# Patient Record
Sex: Male | Born: 1980 | Race: Black or African American | Hispanic: No | Marital: Single | Smoking: Never smoker
Health system: Southern US, Community
[De-identification: ages and names within clinical notes are randomized; demographics above are authoritative.]

## PROBLEM LIST (undated history)

## (undated) DIAGNOSIS — I10 Essential (primary) hypertension: Secondary | ICD-10-CM

## (undated) DIAGNOSIS — E785 Hyperlipidemia, unspecified: Secondary | ICD-10-CM

## (undated) HISTORY — DX: Essential (primary) hypertension: I10

## (undated) HISTORY — DX: Hyperlipidemia, unspecified: E78.5

---

## 2006-09-03 ENCOUNTER — Emergency Department (HOSPITAL_COMMUNITY): Admission: EM | Admit: 2006-09-03 | Discharge: 2006-09-03 | Payer: Self-pay | Admitting: Emergency Medicine

## 2009-07-27 ENCOUNTER — Inpatient Hospital Stay (HOSPITAL_COMMUNITY): Admission: AC | Admit: 2009-07-27 | Discharge: 2009-07-27 | Payer: Self-pay

## 2009-09-03 ENCOUNTER — Ambulatory Visit (HOSPITAL_COMMUNITY): Admission: RE | Admit: 2009-09-03 | Discharge: 2009-09-03 | Payer: Self-pay | Admitting: Otolaryngology

## 2010-07-27 LAB — BASIC METABOLIC PANEL
BUN: 9 mg/dL (ref 6–23)
Creatinine, Ser: 0.81 mg/dL (ref 0.4–1.5)
Glucose, Bld: 102 mg/dL — ABNORMAL HIGH (ref 70–99)
Potassium: 4.4 mEq/L (ref 3.5–5.1)
Sodium: 139 mEq/L (ref 135–145)

## 2010-07-27 LAB — CBC
Hemoglobin: 14.2 g/dL (ref 13.0–17.0)
MCHC: 33.5 g/dL (ref 30.0–36.0)
MCV: 84.2 fL (ref 78.0–100.0)
Platelets: 200 10*3/uL (ref 150–400)
RBC: 5.02 MIL/uL (ref 4.22–5.81)

## 2010-08-02 LAB — CBC
Hemoglobin: 14.7 g/dL (ref 13.0–17.0)
MCV: 84.2 fL (ref 78.0–100.0)
RBC: 5.17 MIL/uL (ref 4.22–5.81)
WBC: 8.4 10*3/uL (ref 4.0–10.5)

## 2010-08-02 LAB — COMPREHENSIVE METABOLIC PANEL
ALT: 19 U/L (ref 0–53)
AST: 28 U/L (ref 0–37)
CO2: 28 meq/L (ref 19–32)
Chloride: 106 meq/L (ref 96–112)
GFR calc Af Amer: >60 mL/min (ref >60)
GFR calc non Af Amer: >60 mL/min (ref >60)
Glucose, Bld: 107 mg/dL — ABNORMAL HIGH (ref 70–99)
Sodium: 142 meq/L (ref 135–145)
Total Bilirubin: 0.8 mg/dL (ref 0.3–1.2)

## 2010-08-02 LAB — TYPE AND SCREEN: Antibody Screen: NEGATIVE

## 2010-08-02 LAB — POCT I-STAT, CHEM 8
BUN: 7 mg/dL (ref 6–23)
Chloride: 105 meq/L (ref 96–112)
Creatinine, Ser: 1.2 mg/dL (ref 0.4–1.5)
Glucose, Bld: 106 mg/dL — ABNORMAL HIGH (ref 70–99)
HCT: 48 % (ref 39.0–52.0)
Potassium: 3.8 meq/L (ref 3.5–5.1)

## 2018-05-28 ENCOUNTER — Emergency Department (HOSPITAL_COMMUNITY): Payer: No Typology Code available for payment source

## 2018-05-28 ENCOUNTER — Emergency Department (HOSPITAL_COMMUNITY)
Admission: EM | Admit: 2018-05-28 | Discharge: 2018-05-28 | Disposition: A | Discharge status: Home / Self Care | Payer: No Typology Code available for payment source | Attending: Emergency Medicine | Admitting: Emergency Medicine

## 2018-05-28 ENCOUNTER — Encounter (HOSPITAL_COMMUNITY): Payer: Self-pay | Admitting: *Deleted

## 2018-05-28 DIAGNOSIS — R0789 Other chest pain: Secondary | ICD-10-CM | POA: Insufficient documentation

## 2018-05-28 MED ORDER — IBUPROFEN 800 MG PO TABS
800.0000 mg | ORAL_TABLET | Freq: Once | ORAL | Status: AC
  Administered 2018-05-28: 800 mg via ORAL
  Filled 2018-05-28: qty 1

## 2018-05-28 MED ORDER — METHOCARBAMOL 750 MG PO TABS: 750.0000 mg | ORAL_TABLET | Freq: Four times a day (QID) | ORAL | 0 refills | Status: AC

## 2018-05-28 MED ORDER — IBUPROFEN 400 MG PO TABS: 400.0000 mg | ORAL_TABLET | Freq: Four times a day (QID) | ORAL | 0 refills | Status: AC | PRN
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
## 2020-06-16 ENCOUNTER — Encounter: Payer: Self-pay | Admitting: Surgery

## 2020-06-16 ENCOUNTER — Ambulatory Visit: Payer: Self-pay

## 2020-06-16 ENCOUNTER — Ambulatory Visit (INDEPENDENT_AMBULATORY_CARE_PROVIDER_SITE_OTHER): Payer: BLUE CROSS/BLUE SHIELD | Admitting: Surgery

## 2020-06-16 ENCOUNTER — Other Ambulatory Visit: Payer: Self-pay

## 2020-06-16 VITALS — BP 146/87 | HR 65 | Ht 68.0 in | Wt 167.0 lb

## 2020-06-16 DIAGNOSIS — S5332XA Traumatic rupture of left ulnar collateral ligament, initial encounter: Secondary | ICD-10-CM | POA: Diagnosis not present

## 2020-06-16 DIAGNOSIS — M79645 Pain in left finger(s): Secondary | ICD-10-CM

## 2020-06-16 DIAGNOSIS — S63642A Sprain of metacarpophalangeal joint of left thumb, initial encounter: Secondary | ICD-10-CM

## 2020-06-16 NOTE — Progress Notes (Signed)
   Office Visit Note   Patient: Sean Drake           Date of Birth: Jun 22, 1980           MRN: [id number] Visit Date: 06/16/2020              Requested by: No referring provider defined for this encounter. PCP: Patient, No Pcp Per   Assessment & Plan: Visit Diagnoses:  1. Thumb pain, left   2. Gamekeeper's thumb of left hand, initial encounter     Plan: At this point I will attempt conservative treatment.  Patient was put in a thumb spica splint and advised not to use his hand.  Stressed on the importance of not do anything that could make this injury worse which could lead to the need for surgical intervention.  He voiced understanding.  Follow-up in 1 week for recheck.  May need MRI left thumb and possible referral to Dr. Dominica Severin hand specialist with [facility].  Follow-Up Instructions: Return in about 1 week (around 06/23/2020) for recheck left thumb gamekeeper.   Orders:  Orders Placed This Encounter  Procedures  . XR Finger Thumb Left   No orders of the defined types were placed in this encounter.     Procedures: No procedures performed   Clinical Data: No additional findings.   Subjective: Chief Complaint  Patient presents with  . Left Thumb - Pain    HPI 40 year old white male comes in today with complaints of left thumb pain.  States that about 2 weeks ago he was "horse playing" when his thumb got bent back the wrong way.  He had immediate pain and swelling after the injury.  He is right-hand dominant. Review of Systems No current cardiac pulmonary GI GU issues  Objective: Vital Signs: BP (!) 146/87   Pulse 65   Ht 5\' 8"  (1.727 m)   Wt 167 lb (75.8 kg)   BMI 25.39 kg/m   Physical Exam HENT:     Head: Normocephalic.  Eyes:     Extraocular Movements: Extraocular movements intact.  Pulmonary:     Effort: No respiratory distress.  Musculoskeletal:     Comments: Right thumb is moderately swollen.  He is exquisitely tender at the thumb  ulnar collateral ligament.  I am unable to assess ligament stability due to pain and swelling.  He has limited range of motion of the thumb with flexion extension due to pain and swelling.  Neurological:     Mental Status: He is alert and oriented to person, place, and time.  Psychiatric:        Mood and Affect: Mood normal.     Ortho Exam  Specialty Comments:  No specialty comments available.  Imaging: No results found.   PMFS History: There are no problems to display for this patient.  History reviewed. No pertinent past medical history.  History reviewed. No pertinent family history.  History reviewed. No pertinent surgical history. Social History   Occupational History  . Not on file  Tobacco Use  . Smoking status: Not on file  . Smokeless tobacco: Not on file  Substance and Sexual Activity  . Alcohol use: Not on file  . Drug use: Not on file  . Sexual activity: Not on file

## 2020-06-25 ENCOUNTER — Telehealth: Payer: Self-pay | Admitting: Surgery

## 2020-06-25 ENCOUNTER — Ambulatory Visit (INDEPENDENT_AMBULATORY_CARE_PROVIDER_SITE_OTHER): Payer: BC Managed Care – PPO | Admitting: Surgery

## 2020-06-25 ENCOUNTER — Encounter: Payer: Self-pay | Admitting: Surgery

## 2020-06-25 VITALS — BP 165/105 | HR 65 | Ht 68.0 in | Wt 167.0 lb

## 2020-06-25 DIAGNOSIS — S5332XA Traumatic rupture of left ulnar collateral ligament, initial encounter: Secondary | ICD-10-CM

## 2020-06-25 DIAGNOSIS — S63642A Sprain of metacarpophalangeal joint of left thumb, initial encounter: Secondary | ICD-10-CM

## 2020-06-25 MED ORDER — DICLOFENAC SODIUM 75 MG PO TBEC: 75.0000 mg | DELAYED_RELEASE_TABLET | Freq: Two times a day (BID) | ORAL | 0 refills | Status: AC | PRN

## 2020-06-25 MED ORDER — DICLOFENAC SODIUM 75 MG PO TBEC: 75.0000 mg | DELAYED_RELEASE_TABLET | Freq: Two times a day (BID) | ORAL | 0 refills | Status: AC

## 2020-06-25 NOTE — Telephone Encounter (Signed)
Rx resent to [facility] on [address] [place].

## 2020-06-25 NOTE — Addendum Note (Signed)
Addended by: Naida Sleight on: 06/25/2020 04:58 PM   Modules accepted: Orders

## 2020-06-25 NOTE — Telephone Encounter (Signed)
Rec'd call from [place] who is with the patient. She asked that the Rx be sent over to [facility] on [address].  They are going there now. The number to contact patient is [phone]

## 2020-06-25 NOTE — Progress Notes (Signed)
40 year old black male returns for recheck of his left gamekeeper's thumb.  States that he is feeling better and swelling has decreased.  Has been compliant with wearing his thumb spica brace.   Exam Swelling has decreased.  He continues to be tender over the ulnar collateral ligament but this is improved from last office visit.  He does have slight trace laxity of the ligament.  Plan I will have patient follow-up with me in 2 weeks for recheck.  He will continue wearing the brace.  He will come out of this to work gentle range of motion of his thumb but definitely nothing aggressive.  He knows not to try to push through any discomfort that he may have.  I sent in Voltaren 75 mg p.o. twice daily as needed to be taken with food to his pharmacy.  Discontinue ibuprofen.

## 2020-07-09 ENCOUNTER — Other Ambulatory Visit: Payer: Self-pay

## 2020-07-09 ENCOUNTER — Ambulatory Visit: Payer: BC Managed Care – PPO | Admitting: Surgery

## 2020-07-09 ENCOUNTER — Encounter: Payer: Self-pay | Admitting: Surgery

## 2020-07-09 ENCOUNTER — Ambulatory Visit (INDEPENDENT_AMBULATORY_CARE_PROVIDER_SITE_OTHER): Payer: BC Managed Care – PPO | Admitting: Surgery

## 2020-07-09 VITALS — BP 169/98 | HR 80 | Ht 68.0 in

## 2020-07-09 DIAGNOSIS — S63642A Sprain of metacarpophalangeal joint of left thumb, initial encounter: Secondary | ICD-10-CM

## 2020-07-09 DIAGNOSIS — S5332XA Traumatic rupture of left ulnar collateral ligament, initial encounter: Secondary | ICD-10-CM

## 2020-07-09 NOTE — Progress Notes (Signed)
40 year old black male comes in for recheck of his left gamekeeper's thumb.  He is about 4 weeks out from his injury.  States that he is feeling a lot better.  He has been extremely compliant with wearing his brace.  Still has some soreness as to be expected around the ulnar collateral ligament.  Does state that his swelling has improved.  Continues to have some stiffness of the thumb as expected but overall he is pleased with his progress up to this point.  Exam Swelling is down.  He has good extension of his thumb.  Some limitation with thumb flexion.  He is tender along the ulnar collateral ligament.  I did not attempt to stress the ligament today on exam.   Plan Advised patient I am pleased with his progress up to this point.  He will continue current treatment with the thumb spica brace.  Still no aggressive activity.  He will follow-up with me in 3 weeks for recheck and I will make decision at that time as to whether or not I should refer to a hand specialist.  I do think he is going in the right direction.

## 2020-07-17 ENCOUNTER — Encounter: Payer: Self-pay | Admitting: Surgery

## 2020-07-30 ENCOUNTER — Encounter: Payer: Self-pay | Admitting: Surgery

## 2020-07-30 ENCOUNTER — Ambulatory Visit (INDEPENDENT_AMBULATORY_CARE_PROVIDER_SITE_OTHER): Payer: BC Managed Care – PPO | Admitting: Surgery

## 2020-07-30 VITALS — BP 151/92 | HR 72 | Ht 68.0 in | Wt 167.0 lb

## 2020-07-30 DIAGNOSIS — S5332XA Traumatic rupture of left ulnar collateral ligament, initial encounter: Secondary | ICD-10-CM

## 2020-07-30 DIAGNOSIS — S63642A Sprain of metacarpophalangeal joint of left thumb, initial encounter: Secondary | ICD-10-CM

## 2020-07-30 NOTE — Progress Notes (Signed)
   Office Visit Note   Patient: Sean Drake           Date of Birth: 1981/03/10           MRN: [id number] Visit Date: 07/30/2020              Requested by: No referring provider defined for this encounter. PCP: Patient, No Pcp Per   Assessment & Plan: Visit Diagnoses:  1. Gamekeeper's thumb of left hand, initial encounter     Plan: I discussed patient's diagnosis with Dr. Dorene Grebe in the clinic this morning.  He agrees with getting MRI arthrogram and this was ordered.  Patient will continue wearing his thumb spica brace.  I gave him another note for work allowing him to do light duty but he absolutely cannot use his left hand.  I will have him follow-up to review the MRI on the morning that Dr. August Saucer is in clinic so I can review with him as well.  We will make decision at that time as to whether or not I need to refer him to Dr. Dominica Severin hand surgeon.  Follow-Up Instructions: Return in about 1 week (around 08/06/2020) for with Vuk Skillern.  please schedule on morning when dr dean will be in clinic.   Orders:  Orders Placed This Encounter  Procedures  . MR Wrist Left w/o contrast  . Arthrogram   No orders of the defined types were placed in this encounter.     Procedures: No procedures performed   Clinical Data: No additional findings.   Subjective: Chief Complaint  Patient presents with  . Left Thumb - Follow-up    HPI 40 year old white male returns for recheck of his left gamekeeper's thumb.  He is about 6 weeks into my conservative management and close to 7 to 8 weeks out from his injury.  He has been compliant with wearing his brace but he states that over the last couple of weeks his job has been more demanding and he has been having to use his left hand.  He has been having more pain in his thumb along with some swelling   Objective: Vital Signs: BP (!) 151/92   Pulse 72   Ht 5\' 8"  (1.727 m)   Wt 167 lb (75.8 kg)   BMI 25.39 kg/m   Physical Exam Left  thumb he has some swelling at the MCP joint.  He is exquisitely tender over the ulnar collateral ligament. Ortho Exam  Specialty Comments:  No specialty comments available.  Imaging: No results found.   PMFS History: There are no problems to display for this patient.  History reviewed. No pertinent past medical history.  History reviewed. No pertinent family history.  History reviewed. No pertinent surgical history. Social History   Occupational History  . Not on file  Tobacco Use  . Smoking status: Not on file  . Smokeless tobacco: Not on file  Substance and Sexual Activity  . Alcohol use: Not on file  . Drug use: Not on file  . Sexual activity: Not on file

## 2020-08-25 ENCOUNTER — Other Ambulatory Visit: Payer: Self-pay

## 2020-08-25 ENCOUNTER — Ambulatory Visit
Admission: RE | Admit: 2020-08-25 | Discharge: 2020-08-25 | Disposition: A | Discharge status: Home / Self Care | Payer: BC Managed Care – PPO | Source: Ambulatory Visit | Attending: Surgery | Admitting: Surgery

## 2020-08-25 DIAGNOSIS — S63642A Sprain of metacarpophalangeal joint of left thumb, initial encounter: Secondary | ICD-10-CM

## 2020-08-25 DIAGNOSIS — M25542 Pain in joints of left hand: Secondary | ICD-10-CM | POA: Diagnosis not present

## 2020-08-25 DIAGNOSIS — S5332XA Traumatic rupture of left ulnar collateral ligament, initial encounter: Secondary | ICD-10-CM

## 2020-08-25 MED ORDER — IOPAMIDOL (ISOVUE-M 200) INJECTION 41%
1.0000 mL | Freq: Once | INTRAMUSCULAR | Status: AC
  Administered 2020-08-25: 1 mL via INTRA_ARTICULAR

## 2020-09-03 ENCOUNTER — Encounter: Payer: Self-pay | Admitting: Surgery

## 2020-09-03 ENCOUNTER — Ambulatory Visit (INDEPENDENT_AMBULATORY_CARE_PROVIDER_SITE_OTHER): Payer: BC Managed Care – PPO | Admitting: Surgery

## 2020-09-03 ENCOUNTER — Other Ambulatory Visit: Payer: Self-pay

## 2020-09-03 VITALS — BP 169/102 | HR 64 | Ht 68.0 in | Wt 167.0 lb

## 2020-09-03 DIAGNOSIS — S63642A Sprain of metacarpophalangeal joint of left thumb, initial encounter: Secondary | ICD-10-CM

## 2020-09-03 DIAGNOSIS — S5332XA Traumatic rupture of left ulnar collateral ligament, initial encounter: Secondary | ICD-10-CM | POA: Diagnosis not present

## 2020-09-04 NOTE — Progress Notes (Signed)
Office Visit Note   Patient: Sean Drake           Date of Birth: 02/20/1981           MRN: [id number] Visit Date: 09/03/2020              Requested by: No referring provider defined for this encounter. PCP: Patient, No Pcp Per (Inactive)   Assessment & Plan: Visit Diagnoses:  1. Gamekeeper's thumb of left hand, initial encounter     Plan: We will refer patient over to Dr. Dominica Severin hand surgeon with [facility].  I forwarded Dr. Amanda Pea patient's information and he will have his office staff contact patient for an appointment.  I made patient appointment to see me back in 4 weeks for recheck.  If he has seen Dr. Amanda Pea by that time he can cancel but I did ask patient to let me know what comes out of that appointment.  He will continue wearing his thumb spica brace.  Also took him out of work for the remainder of this week and also next week.  Follow-Up Instructions: Return in about 4 weeks (around 10/01/2020).   Orders:  No orders of the defined types were placed in this encounter.  No orders of the defined types were placed in this encounter.     Procedures: No procedures performed   Clinical Data: No additional findings.   Subjective: Chief Complaint  Patient presents with  . Left Hand - Follow-up    HPI 40 year old black male returns for review of his left thumb MRI.  Study from August 25, 2020 showed:  CLINICAL DATA:  Left thumb pain since late January, 2022 when the patient suffered a hyperextension injury.  EXAM: MRI OF THE LEFT THUMB WITH CONTRAST (MR Arthrogram)  TECHNIQUE: Multiplanar, multisequence MR imaging of the left first MCP joint was performed following intra-articular contrast injection under fluoroscopic guidance. No intravenous contrast was administered.  COMPARISON:  None.  FINDINGS: Bone marrow signal is normal without fracture, contusion or focal lesion. The ulnar collateral ligament of the thumb is attenuated  and partially torn from the head of the first metacarpal. No Stener lesion is identified. Ligaments otherwise appear normal. Tendons of the thumb are intact. No pulley injury is identified.  IMPRESSION: Sprain of the ulnar collateral ligament of the first MCP joint with a partial tear of the ligament from the head of the first metacarpal. Intact fibers are present. Negative for Stener lesion.   Electronically Signed   By: Drusilla Kanner M.D.   On: 08/25/2020 16:07  He continues have ongoing pain in his thumb.  I had reached out to Dr. Dominica Severin hand surgeon yesterday and he stated that he would see patient.  Objective: Vital Signs: BP (!) 169/102   Pulse 64   Ht 5\' 8"  (1.727 m)   Wt 167 lb (75.8 kg)   BMI 25.39 kg/m   Physical Exam Very pleasant black male alert and oriented in no acute distress.  His thumb is less swollen today.  Continues to be exquisitely tender at the ulnar collateral ligament. Ortho Exam  Specialty Comments:  No specialty comments available.  Imaging: No results found.   PMFS History: There are no problems to display for this patient.  History reviewed. No pertinent past medical history.  History reviewed. No pertinent family history.  History reviewed. No pertinent surgical history. Social History   Occupational History  . Not on file  Tobacco Use  . Smoking status:  Not on file  . Smokeless tobacco: Not on file  Substance and Sexual Activity  . Alcohol use: Not on file  . Drug use: Not on file  . Sexual activity: Not on file

## 2020-09-14 DIAGNOSIS — S5332XA Traumatic rupture of left ulnar collateral ligament, initial encounter: Secondary | ICD-10-CM | POA: Diagnosis not present

## 2020-09-14 DIAGNOSIS — M79642 Pain in left hand: Secondary | ICD-10-CM | POA: Diagnosis not present

## 2020-10-01 ENCOUNTER — Ambulatory Visit: Payer: BC Managed Care – PPO | Admitting: Surgery

## 2020-10-20 DIAGNOSIS — F1010 Alcohol abuse, uncomplicated: Secondary | ICD-10-CM | POA: Diagnosis not present

## 2020-10-20 DIAGNOSIS — Z1159 Encounter for screening for other viral diseases: Secondary | ICD-10-CM | POA: Diagnosis not present

## 2020-10-20 DIAGNOSIS — R030 Elevated blood-pressure reading, without diagnosis of hypertension: Secondary | ICD-10-CM | POA: Diagnosis not present

## 2020-10-20 DIAGNOSIS — Z720 Tobacco use: Secondary | ICD-10-CM | POA: Diagnosis not present

## 2020-10-20 DIAGNOSIS — Z0000 Encounter for general adult medical examination without abnormal findings: Secondary | ICD-10-CM | POA: Diagnosis not present

## 2020-10-20 DIAGNOSIS — Z131 Encounter for screening for diabetes mellitus: Secondary | ICD-10-CM | POA: Diagnosis not present

## 2020-10-20 DIAGNOSIS — Z113 Encounter for screening for infections with a predominantly sexual mode of transmission: Secondary | ICD-10-CM | POA: Diagnosis not present

## 2020-10-20 DIAGNOSIS — Z13220 Encounter for screening for lipoid disorders: Secondary | ICD-10-CM | POA: Diagnosis not present

## 2020-11-05 DIAGNOSIS — I10 Essential (primary) hypertension: Secondary | ICD-10-CM | POA: Diagnosis not present

## 2020-11-05 DIAGNOSIS — A749 Chlamydial infection, unspecified: Secondary | ICD-10-CM | POA: Diagnosis not present

## 2020-11-05 DIAGNOSIS — E7849 Other hyperlipidemia: Secondary | ICD-10-CM | POA: Diagnosis not present

## 2021-12-08 DIAGNOSIS — E7849 Other hyperlipidemia: Secondary | ICD-10-CM | POA: Diagnosis not present

## 2021-12-08 DIAGNOSIS — R7303 Prediabetes: Secondary | ICD-10-CM | POA: Diagnosis not present

## 2021-12-08 DIAGNOSIS — I10 Essential (primary) hypertension: Secondary | ICD-10-CM | POA: Diagnosis not present

## 2022-02-09 DIAGNOSIS — Z0000 Encounter for general adult medical examination without abnormal findings: Secondary | ICD-10-CM | POA: Diagnosis not present

## 2022-02-09 DIAGNOSIS — I10 Essential (primary) hypertension: Secondary | ICD-10-CM | POA: Diagnosis not present

## 2022-02-09 DIAGNOSIS — R7303 Prediabetes: Secondary | ICD-10-CM | POA: Diagnosis not present

## 2022-02-09 DIAGNOSIS — E7849 Other hyperlipidemia: Secondary | ICD-10-CM | POA: Diagnosis not present

## 2022-04-24 ENCOUNTER — Encounter (HOSPITAL_BASED_OUTPATIENT_CLINIC_OR_DEPARTMENT_OTHER): Payer: Self-pay | Admitting: Emergency Medicine

## 2022-04-24 ENCOUNTER — Emergency Department (HOSPITAL_BASED_OUTPATIENT_CLINIC_OR_DEPARTMENT_OTHER)
Admission: EM | Admit: 2022-04-24 | Discharge: 2022-04-24 | Disposition: A | Discharge status: Home / Self Care | Payer: BC Managed Care – PPO | Attending: Emergency Medicine | Admitting: Emergency Medicine

## 2022-04-24 ENCOUNTER — Other Ambulatory Visit: Payer: Self-pay

## 2022-04-24 DIAGNOSIS — J111 Influenza due to unidentified influenza virus with other respiratory manifestations: Secondary | ICD-10-CM | POA: Insufficient documentation

## 2022-04-24 DIAGNOSIS — Z1152 Encounter for screening for COVID-19: Secondary | ICD-10-CM | POA: Insufficient documentation

## 2022-04-24 DIAGNOSIS — J101 Influenza due to other identified influenza virus with other respiratory manifestations: Secondary | ICD-10-CM

## 2022-04-24 DIAGNOSIS — R059 Cough, unspecified: Secondary | ICD-10-CM | POA: Diagnosis not present

## 2022-04-24 LAB — RESP PANEL BY RT-PCR (RSV, FLU A&B, COVID)  RVPGX2
Influenza A by PCR: POSITIVE — AB
Influenza B by PCR: NEGATIVE
Resp Syncytial Virus by PCR: NEGATIVE
SARS Coronavirus 2 by RT PCR: NEGATIVE

## 2022-04-24 MED ORDER — ACETAMINOPHEN 325 MG PO TABS
650.0000 mg | ORAL_TABLET | Freq: Once | ORAL | Status: AC
  Administered 2022-04-24: 650 mg via ORAL
  Filled 2022-04-24: qty 2

## 2022-04-24 MED ORDER — IBUPROFEN 800 MG PO TABS
800.0000 mg | ORAL_TABLET | Freq: Once | ORAL | Status: AC
  Administered 2022-04-24: 800 mg via ORAL
  Filled 2022-04-24: qty 1

## 2022-04-24 NOTE — Discharge Instructions (Addendum)
You were seen in the emergency department today for flulike symptoms.  You do have flu A.  Please continue to drink plenty of fluids and eat a gentle diet.  You may use Tylenol or Motrin for fever and bodyaches.  You can also use over-the-counter cough and cold medications to help relieve your symptoms.  Please wear a mask when you are around others to help prevent the spread.  Please return to emergency department for difficulty breathing or shortness of breath with fever concerning for pneumonia.

## 2022-04-24 NOTE — ED Provider Notes (Signed)
[facility] EMERGENCY DEPT Provider Note   CSN: [id number] Arrival date & time: 04/24/22  3419     History  Chief Complaint  Patient presents with   URI    Sean Drake is a 41 y.o. male.  With past medical history of hypertension, hyperlipidemia who presents to the emergency department with cough and fever.  Patient states that about 1 week ago on Sunday he woke up and lost his voice and began having cough.  He states that he felt like he got better and then about 3 days ago began having subjective fever with hot and cold symptoms, myalgias.  He is continued to have cough which has not become productive.  He has been eating and drinking without difficulty.  He denies having shortness of breath or chest pain.  Denies nausea, vomiting, diarrhea.   URI Presenting symptoms: cough and fever        Home Medications Prior to Admission medications   Medication Sig Start Date End Date Taking? Authorizing Provider  diclofenac (VOLTAREN) 75 MG EC tablet Take 1 tablet (75 mg total) by mouth 2 (two) times daily as needed. 06/25/20   Naida Sleight, PA-C  diclofenac (VOLTAREN) 75 MG EC tablet Take 1 tablet (75 mg total) by mouth 2 (two) times daily. Patient not taking: Reported on 07/09/2020 06/25/20   Naida Sleight, PA-C  ibuprofen (ADVIL,MOTRIN) 400 MG tablet Take 1 tablet (400 mg total) by mouth every 6 (six) hours as needed. Patient not taking: No sig reported 05/28/18   Lorre Nick, MD  methocarbamol (ROBAXIN-750) 750 MG tablet Take 1 tablet (750 mg total) by mouth 4 (four) times daily. Patient not taking: No sig reported 05/28/18   Lorre Nick, MD      Allergies    Patient has no known allergies.    Review of Systems   Review of Systems  Constitutional:  Positive for fever.  Respiratory:  Positive for cough.   All other systems reviewed and are negative.   Physical Exam Updated Vital Signs BP 137/87 (BP Location: Right Arm)   Pulse 99   Temp (!) 101.6  F (38.7 C)   Resp 16   SpO2 98%  Physical Exam Vitals and nursing note reviewed.  Constitutional:      General: He is not in acute distress.    Appearance: Normal appearance. He is ill-appearing. He is not toxic-appearing.  HENT:     Head: Normocephalic and atraumatic.     Nose: Congestion present.     Mouth/Throat:     Mouth: Mucous membranes are moist.     Pharynx: Oropharynx is clear. Uvula midline. Posterior oropharyngeal erythema present. No uvula swelling.     Tonsils: No tonsillar exudate or tonsillar abscesses. 0 on the right. 0 on the left.  Eyes:     General: No scleral icterus.    Extraocular Movements: Extraocular movements intact.     Pupils: Pupils are equal, round, and reactive to light.  Cardiovascular:     Rate and Rhythm: Normal rate and regular rhythm.     Pulses: Normal pulses.     Heart sounds: No murmur heard. Pulmonary:     Effort: Pulmonary effort is normal. No respiratory distress.     Breath sounds: Normal breath sounds. No wheezing, rhonchi or rales.  Abdominal:     General: Bowel sounds are normal.     Palpations: Abdomen is soft.  Musculoskeletal:        General: Normal range of  motion.     Cervical back: Normal range of motion and neck supple.  Lymphadenopathy:     Cervical: No cervical adenopathy.  Skin:    General: Skin is warm and dry.     Capillary Refill: Capillary refill takes less than 2 seconds.  Neurological:     General: No focal deficit present.     Mental Status: He is alert and oriented to person, place, and time.  Psychiatric:        Mood and Affect: Mood normal.        Behavior: Behavior normal.     ED Results / Procedures / Treatments   Labs (all labs ordered are listed, but only abnormal results are displayed) Labs Reviewed  RESP PANEL BY RT-PCR (RSV, FLU A&B, COVID)  RVPGX2 - Abnormal; Notable for the following components:      Result Value   Influenza A by PCR POSITIVE (*)    All other components within normal  limits    EKG None  Radiology No results found.  Procedures Procedures    Medications Ordered in ED Medications  acetaminophen (TYLENOL) tablet 650 mg (650 mg Oral Given 04/24/22 1307)  ibuprofen (ADVIL) tablet 800 mg (800 mg Oral Given 04/24/22 1307)    ED Course/ Medical Decision Making/ A&P                           Medical Decision Making Risk OTC drugs. Prescription drug management.  Initial Impression and Ddx 40 year old male who presents to the emergency department with cough and fever.  He is overall well-appearing, nonseptic and nontoxic in appearance.  He is ill-appearing and congested on my exam.  He has dry cough.  I have listened to his lungs which are clear bilaterally.  He did have fever on his presentation here of 101.6 without tachycardia or hypotension.  He is not hypoxic. Patient PMH that increases complexity of ED encounter:  hypertension, hyperlipidemia   Interpretation of Diagnostics I independent reviewed and interpreted the labs as followed: Flu A+   - I independently visualized the following imaging with scope of interpretation limited to determining acute life threatening conditions related to emergency care: Not indicated  Patient Reassessment and Ultimate Disposition/Management Patient presents here with viral upper respiratory symptoms.  Based on his history and physical I doubt acute bacterial sinusitis.  His respiratory testing here is positive for influenza A.  Do not suspect underlying cardiopulmonary process.  I considered but think unlikely dangerous causes of the patient's symptoms like ACS, CHF, COPD exacerbation, pneumonia or pneumothorax.  He is nontoxic-appearing and not in need of emergent medical intervention.  I have discussed home symptomatic treatment like ibuprofen and Tylenol for fever and myalgias.  Encouraged to drink plenty of fluids.  Additionally using over-the-counter cough and cold medications.  He is agreeable with  this.  Return precautions for difficulty breathing or worsening shortness of breath.  The patient has been appropriately medically screened and/or stabilized in the ED. I have low suspicion for any other emergent medical condition which would require further screening, evaluation or treatment in the ED or require inpatient management. At time of discharge the patient is hemodynamically stable and in no acute distress. I have discussed work-up results and diagnosis with patient and answered all questions. Patient is agreeable with discharge plan. We discussed strict return precautions for returning to the emergency department and they verbalized understanding.     Patient management required discussion with  the following services or consulting groups:  None  Complexity of Problems Addressed Acute uncomplicated illness or injury with no diagnostics  Additional Data Reviewed and Analyzed Further history obtained from: Further history from spouse/family member and Care Everywhere  Patient Encounter Risk Assessment SDOH impact on management  Final Clinical Impression(s) / ED Diagnoses Final diagnoses:  Influenza A    Rx / DC Orders ED Discharge Orders     None         Mickie Hillier, PA-C 04/24/22 1422    Malvin Johns, MD 04/24/22 1540

## 2022-04-24 NOTE — ED Triage Notes (Signed)
Cough for about a week, started aching on Friday,worsening congestion during the week. Felt hot/cold yesterday.

## 2022-08-10 DIAGNOSIS — F1010 Alcohol abuse, uncomplicated: Secondary | ICD-10-CM | POA: Diagnosis not present

## 2022-08-10 DIAGNOSIS — Z03818 Encounter for observation for suspected exposure to other biological agents ruled out: Secondary | ICD-10-CM | POA: Diagnosis not present

## 2022-08-10 DIAGNOSIS — R7303 Prediabetes: Secondary | ICD-10-CM | POA: Diagnosis not present

## 2022-08-10 DIAGNOSIS — I10 Essential (primary) hypertension: Secondary | ICD-10-CM | POA: Diagnosis not present

## 2022-08-10 DIAGNOSIS — E7849 Other hyperlipidemia: Secondary | ICD-10-CM | POA: Diagnosis not present

## 2022-08-10 DIAGNOSIS — J029 Acute pharyngitis, unspecified: Secondary | ICD-10-CM | POA: Diagnosis not present

## 2022-10-08 IMAGING — XA DG FLUORO GUIDE NDL PLC/BX
7 series · 7 of 7 positions shown · non-contrast
Comparison: none

CLINICAL DATA: Injury to the thumb with pain.  Gamekeeper's thumb.

[Series 1: ortho adipose · 1 of 1 slices shown (1 of 7)]
[im 1/1]
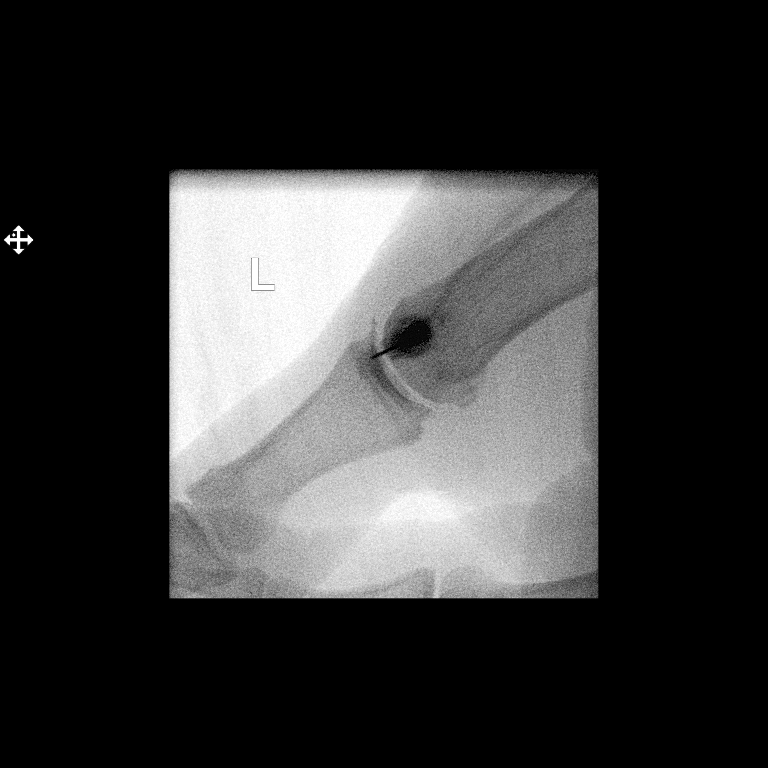

[Series 2: ortho adipose · 1 of 1 slices shown (2 of 7)]
[im 1/1]
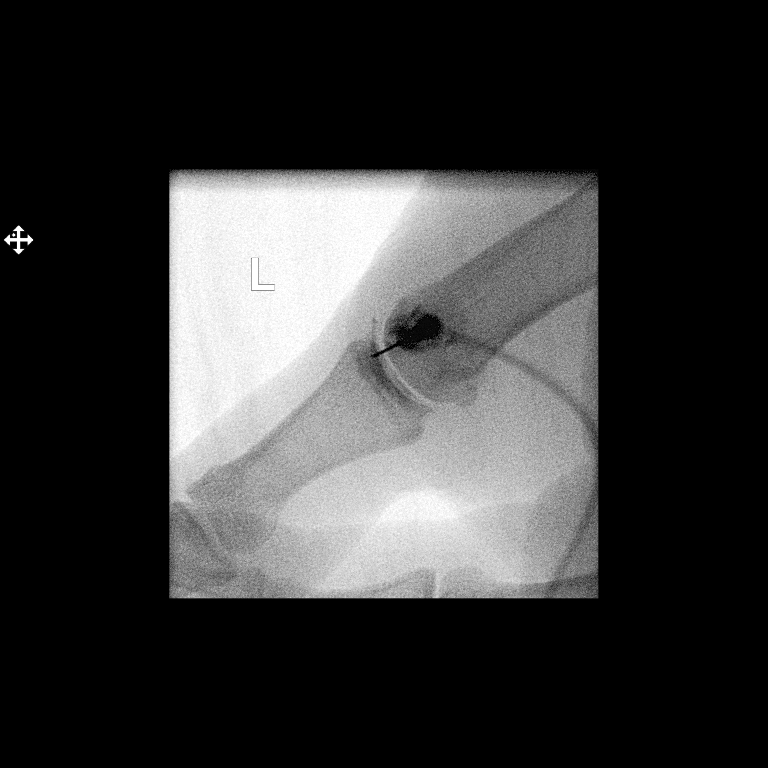

[Series 3: ortho adipose · 1 of 1 slices shown (3 of 7)]
[im 1/1]
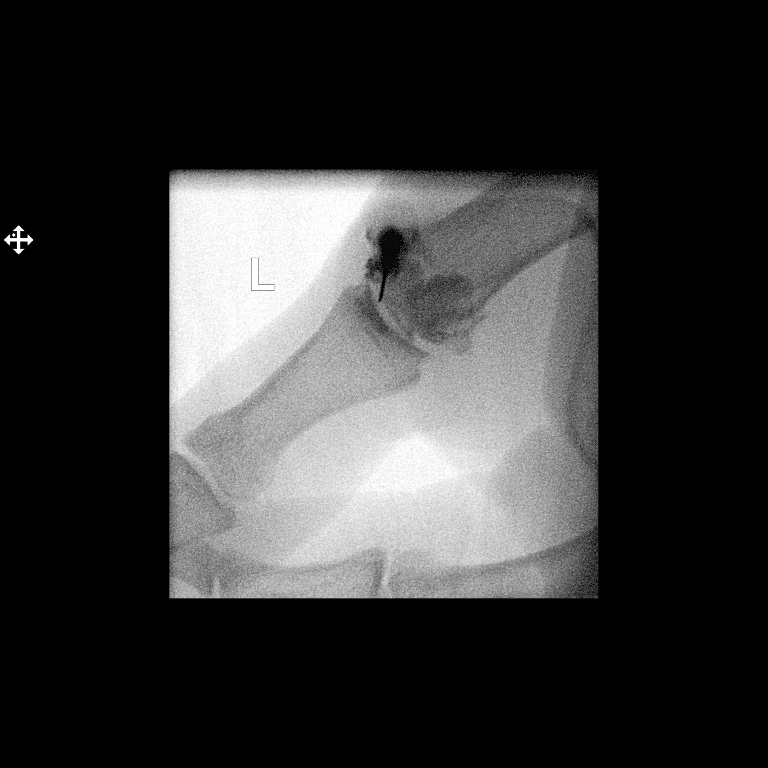

[Series 4: ortho adipose · 1 of 1 slices shown (4 of 7)]
[im 1/1]
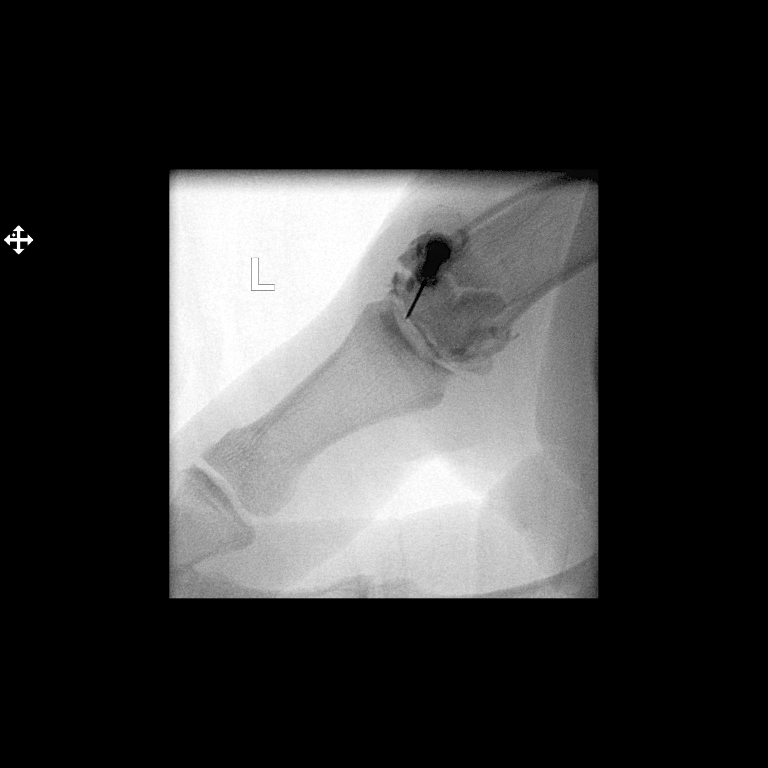

[Series 5: ortho adipose · 1 of 1 slices shown (5 of 7)]
[im 1/1]
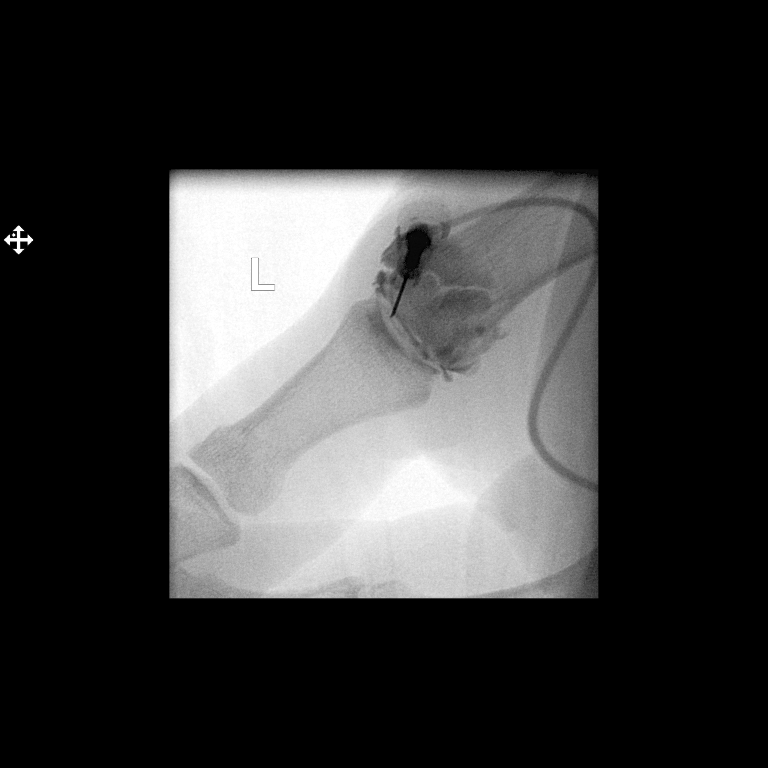

[Series 6: ortho adipose · 1 of 1 slices shown (6 of 7)]
[im 1/1]
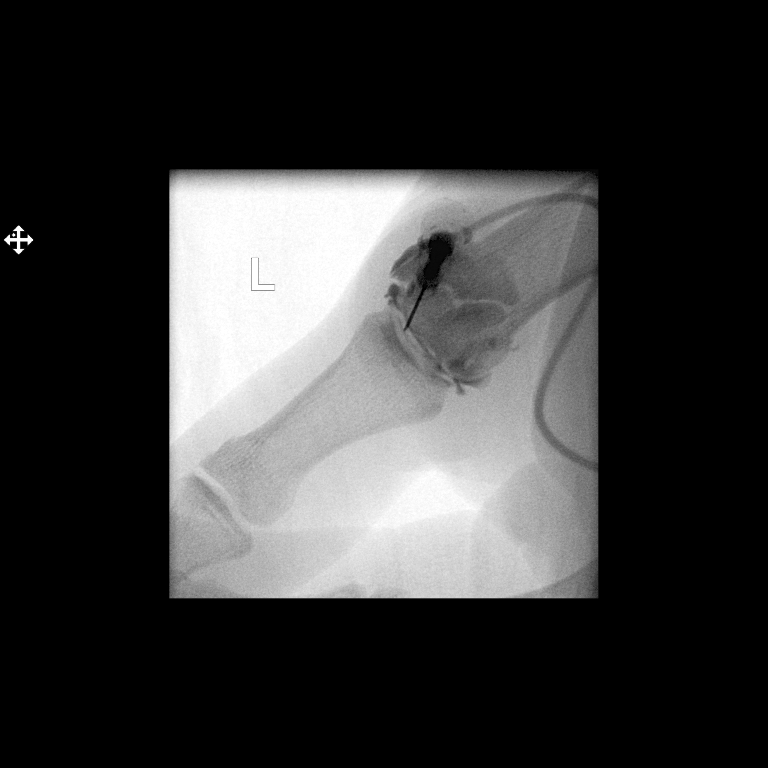

[Series 7: ortho adipose · 1 of 1 slices shown (7 of 7)]
[im 1/1]
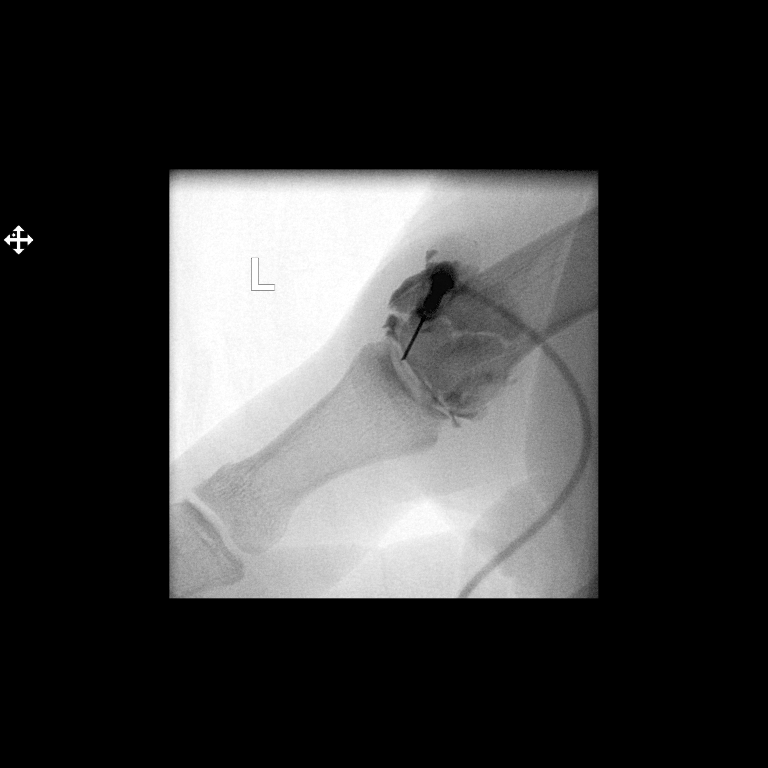

[7 of 7 positions shown; findings below may reference images not displayed]

FLUOROSCOPY TIME:  0 minutes 37 seconds. 0.55 micro gray meter
squared

PROCEDURE:
RIGHT/LEFT thumb MCP JOINT INJECTION UNDER FLUOROSCOPY

The skin dorsal to the MCP joint was scrubbed with Betadine and
draped in sterile fashion. Skin anesthesia was carried [DATE]%
Lidocaine. A 25 Kyra needle was directed into the MCP joint under
flouroscopic guidance. Less than 1 cc of a mixture of 0.1 ml
Multihance and 20 ml of dilute Isovue 200 was then used to fill the
MCP joint. Filming did not show a gross collateral ligament tear.
MRI to follow.
IMPRESSION: Technically successful left thumb MCP joint injection for MRI.

## 2022-10-14 DIAGNOSIS — Z91038 Other insect allergy status: Secondary | ICD-10-CM | POA: Diagnosis not present

## 2022-10-14 DIAGNOSIS — W57XXXA Bitten or stung by nonvenomous insect and other nonvenomous arthropods, initial encounter: Secondary | ICD-10-CM | POA: Diagnosis not present

## 2023-02-22 DIAGNOSIS — R7303 Prediabetes: Secondary | ICD-10-CM | POA: Diagnosis not present

## 2023-02-22 DIAGNOSIS — Z0000 Encounter for general adult medical examination without abnormal findings: Secondary | ICD-10-CM | POA: Diagnosis not present

## 2023-02-22 DIAGNOSIS — E7849 Other hyperlipidemia: Secondary | ICD-10-CM | POA: Diagnosis not present

## 2023-02-22 DIAGNOSIS — F1010 Alcohol abuse, uncomplicated: Secondary | ICD-10-CM | POA: Diagnosis not present

## 2023-02-22 DIAGNOSIS — I10 Essential (primary) hypertension: Secondary | ICD-10-CM | POA: Diagnosis not present

## 2023-02-22 DIAGNOSIS — R748 Abnormal levels of other serum enzymes: Secondary | ICD-10-CM | POA: Diagnosis not present

## 2023-08-16 DIAGNOSIS — Z23 Encounter for immunization: Secondary | ICD-10-CM | POA: Diagnosis not present

## 2023-08-16 DIAGNOSIS — I10 Essential (primary) hypertension: Secondary | ICD-10-CM | POA: Diagnosis not present

## 2023-08-16 DIAGNOSIS — F1010 Alcohol abuse, uncomplicated: Secondary | ICD-10-CM | POA: Diagnosis not present

## 2023-08-16 DIAGNOSIS — J302 Other seasonal allergic rhinitis: Secondary | ICD-10-CM | POA: Diagnosis not present

## 2023-08-16 DIAGNOSIS — E7849 Other hyperlipidemia: Secondary | ICD-10-CM | POA: Diagnosis not present

## 2024-03-06 DIAGNOSIS — F1010 Alcohol abuse, uncomplicated: Secondary | ICD-10-CM | POA: Diagnosis not present

## 2024-03-06 DIAGNOSIS — Z113 Encounter for screening for infections with a predominantly sexual mode of transmission: Secondary | ICD-10-CM | POA: Diagnosis not present

## 2024-03-06 DIAGNOSIS — E1165 Type 2 diabetes mellitus with hyperglycemia: Secondary | ICD-10-CM | POA: Diagnosis not present

## 2024-03-06 DIAGNOSIS — E7849 Other hyperlipidemia: Secondary | ICD-10-CM | POA: Diagnosis not present

## 2024-03-06 DIAGNOSIS — I10 Essential (primary) hypertension: Secondary | ICD-10-CM | POA: Diagnosis not present

## 2024-03-06 DIAGNOSIS — J302 Other seasonal allergic rhinitis: Secondary | ICD-10-CM | POA: Diagnosis not present

## 2024-03-06 DIAGNOSIS — Z0000 Encounter for general adult medical examination without abnormal findings: Secondary | ICD-10-CM | POA: Diagnosis not present

## 2024-03-06 DIAGNOSIS — Z23 Encounter for immunization: Secondary | ICD-10-CM | POA: Diagnosis not present

## 2024-03-06 DIAGNOSIS — Z1211 Encounter for screening for malignant neoplasm of colon: Secondary | ICD-10-CM | POA: Diagnosis not present

## 2024-04-08 DIAGNOSIS — Z1211 Encounter for screening for malignant neoplasm of colon: Secondary | ICD-10-CM | POA: Diagnosis not present
